# Patient Record
Sex: Male | Born: 1945 | Race: White | Hispanic: No | Marital: Married | Smoking: Current every day smoker
Health system: Southern US, Community
[De-identification: ages and names within clinical notes are randomized; demographics above are authoritative.]

## PROBLEM LIST (undated history)

## (undated) DIAGNOSIS — I4891 Unspecified atrial fibrillation: Secondary | ICD-10-CM

## (undated) DIAGNOSIS — J449 Chronic obstructive pulmonary disease, unspecified: Secondary | ICD-10-CM

## (undated) DIAGNOSIS — I493 Ventricular premature depolarization: Secondary | ICD-10-CM

## (undated) DIAGNOSIS — H9190 Unspecified hearing loss, unspecified ear: Secondary | ICD-10-CM

## (undated) DIAGNOSIS — I10 Essential (primary) hypertension: Secondary | ICD-10-CM

## (undated) DIAGNOSIS — Z972 Presence of dental prosthetic device (complete) (partial): Secondary | ICD-10-CM

## (undated) DIAGNOSIS — I714 Abdominal aortic aneurysm, without rupture, unspecified: Secondary | ICD-10-CM

## (undated) DIAGNOSIS — I219 Acute myocardial infarction, unspecified: Secondary | ICD-10-CM

## (undated) DIAGNOSIS — Z77098 Contact with and (suspected) exposure to other hazardous, chiefly nonmedicinal, chemicals: Secondary | ICD-10-CM

## (undated) DIAGNOSIS — Z789 Other specified health status: Secondary | ICD-10-CM

## (undated) HISTORY — PX: NECK SURGERY: SHX720

## (undated) HISTORY — PX: ABDOMINAL AORTIC ANEURYSM REPAIR: SHX42

## (undated) HISTORY — PX: CARDIAC CATHETERIZATION: SHX172

---

## 2009-04-24 DIAGNOSIS — I639 Cerebral infarction, unspecified: Secondary | ICD-10-CM

## 2009-04-24 HISTORY — DX: Cerebral infarction, unspecified: I63.9

## 2010-06-28 ENCOUNTER — Emergency Department: Payer: Self-pay | Admitting: Emergency Medicine
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
## 2021-08-03 ENCOUNTER — Encounter: Payer: Self-pay | Admitting: Ophthalmology

## 2021-08-05 NOTE — Discharge Instructions (Signed)

## 2021-08-09 ENCOUNTER — Other Ambulatory Visit: Payer: Self-pay

## 2021-08-09 ENCOUNTER — Ambulatory Visit: Payer: Medicare Other | Admitting: Anesthesiology

## 2021-08-09 ENCOUNTER — Ambulatory Visit
Admission: RE | Admit: 2021-08-09 | Discharge: 2021-08-09 | Disposition: A | Discharge status: Home / Self Care | Payer: Medicare Other | Attending: Ophthalmology | Admitting: Ophthalmology

## 2021-08-09 ENCOUNTER — Encounter
Admission: RE | Disposition: A | Discharge status: Home / Self Care | Payer: Self-pay | Source: Home / Self Care | Attending: Ophthalmology

## 2021-08-09 ENCOUNTER — Encounter: Payer: Self-pay | Admitting: Ophthalmology

## 2021-08-09 DIAGNOSIS — I252 Old myocardial infarction: Secondary | ICD-10-CM | POA: Insufficient documentation

## 2021-08-09 DIAGNOSIS — H2511 Age-related nuclear cataract, right eye: Secondary | ICD-10-CM | POA: Insufficient documentation

## 2021-08-09 DIAGNOSIS — I10 Essential (primary) hypertension: Secondary | ICD-10-CM | POA: Insufficient documentation

## 2021-08-09 DIAGNOSIS — I4891 Unspecified atrial fibrillation: Secondary | ICD-10-CM | POA: Insufficient documentation

## 2021-08-09 DIAGNOSIS — F17210 Nicotine dependence, cigarettes, uncomplicated: Secondary | ICD-10-CM | POA: Diagnosis not present

## 2021-08-09 DIAGNOSIS — J449 Chronic obstructive pulmonary disease, unspecified: Secondary | ICD-10-CM | POA: Insufficient documentation

## 2021-08-09 DIAGNOSIS — Z955 Presence of coronary angioplasty implant and graft: Secondary | ICD-10-CM | POA: Insufficient documentation

## 2021-08-09 DIAGNOSIS — Z8673 Personal history of transient ischemic attack (TIA), and cerebral infarction without residual deficits: Secondary | ICD-10-CM | POA: Diagnosis not present

## 2021-08-09 HISTORY — DX: Unspecified hearing loss, unspecified ear: H91.90

## 2021-08-09 HISTORY — DX: Contact with and (suspected) exposure to other hazardous, chiefly nonmedicinal, chemicals: Z77.098

## 2021-08-09 HISTORY — DX: Acute myocardial infarction, unspecified: I21.9

## 2021-08-09 HISTORY — DX: Unspecified atrial fibrillation: I48.91

## 2021-08-09 HISTORY — DX: Essential (primary) hypertension: I10

## 2021-08-09 HISTORY — DX: Other specified health status: Z78.9

## 2021-08-09 HISTORY — DX: Ventricular premature depolarization: I49.3

## 2021-08-09 HISTORY — DX: Presence of dental prosthetic device (complete) (partial): Z97.2

## 2021-08-09 HISTORY — DX: Abdominal aortic aneurysm, without rupture, unspecified: I71.40

## 2021-08-09 HISTORY — PX: CATARACT EXTRACTION W/PHACO: SHX586

## 2021-08-09 HISTORY — DX: Chronic obstructive pulmonary disease, unspecified: J44.9

## 2021-08-09 SURGERY — PHACOEMULSIFICATION, CATARACT, WITH IOL INSERTION
Anesthesia: Monitor Anesthesia Care | Site: Eye | Laterality: Right

## 2021-08-09 MED ORDER — SIGHTPATH DOSE#1 BSS IO SOLN
INTRAOCULAR | Status: DC | PRN
  Administered 2021-08-09: 48 mL via OPHTHALMIC

## 2021-08-09 MED ORDER — MOXIFLOXACIN HCL 0.5 % OP SOLN
OPHTHALMIC | Status: DC | PRN
  Administered 2021-08-09: 0.2 mL via OPHTHALMIC

## 2021-08-09 MED ORDER — SIGHTPATH DOSE#1 BSS IO SOLN
INTRAOCULAR | Status: DC | PRN
  Administered 2021-08-09: 15 mL

## 2021-08-09 MED ORDER — SIGHTPATH DOSE#1 NA CHONDROIT SULF-NA HYALURON 40-17 MG/ML IO SOLN
INTRAOCULAR | Status: DC | PRN
  Administered 2021-08-09: 1 mL via INTRAOCULAR

## 2021-08-09 MED ORDER — TETRACAINE HCL 0.5 % OP SOLN
1.0000 [drp] | OPHTHALMIC | Status: DC | PRN
  Administered 2021-08-09 (×3): 1 [drp] via OPHTHALMIC

## 2021-08-09 MED ORDER — MIDAZOLAM HCL 2 MG/2ML IJ SOLN
INTRAMUSCULAR | Status: DC | PRN
  Administered 2021-08-09: 1 mg via INTRAVENOUS

## 2021-08-09 MED ORDER — FENTANYL CITRATE (PF) 100 MCG/2ML IJ SOLN
INTRAMUSCULAR | Status: DC | PRN
  Administered 2021-08-09: 50 ug via INTRAVENOUS

## 2021-08-09 MED ORDER — SIGHTPATH DOSE#1 BSS IO SOLN
INTRAOCULAR | Status: DC | PRN
  Administered 2021-08-09: 1 mL via INTRAMUSCULAR

## 2021-08-09 MED ORDER — OXYCODONE HCL 5 MG/5ML PO SOLN: 5.0000 mg | Freq: Once | ORAL | Status: DC | PRN

## 2021-08-09 MED ORDER — OXYCODONE HCL 5 MG PO TABS: 5.0000 mg | ORAL_TABLET | Freq: Once | ORAL | Status: DC | PRN

## 2021-08-09 MED ORDER — LACTATED RINGERS IV SOLN: INTRAVENOUS | Status: DC

## 2021-08-09 MED ORDER — BRIMONIDINE TARTRATE-TIMOLOL 0.2-0.5 % OP SOLN
OPHTHALMIC | Status: DC | PRN
  Administered 2021-08-09: 1 [drp] via OPHTHALMIC

## 2021-08-09 MED ORDER — ARMC OPHTHALMIC DILATING DROPS
1.0000 "application " | OPHTHALMIC | Status: DC | PRN
  Administered 2021-08-09 (×3): 1 via OPHTHALMIC

## 2021-08-09 SURGICAL SUPPLY — 10 items
CATARACT SUITE SIGHTPATH (MISCELLANEOUS) ×2 IMPLANT
FEE CATARACT SUITE SIGHTPATH (MISCELLANEOUS) ×1 IMPLANT
GLOVE SURG ENC TEXT LTX SZ8 (GLOVE) ×2 IMPLANT
GLOVE SURG TRIUMPH 8.0 PF LTX (GLOVE) ×2 IMPLANT
LENS IOL TECNIS EYHANCE 20.5 (Intraocular Lens) ×1 IMPLANT
NDL FILTER BLUNT 18X1 1/2 (NEEDLE) ×1 IMPLANT
NEEDLE FILTER BLUNT 18X 1/2SAF (NEEDLE) ×1
NEEDLE FILTER BLUNT 18X1 1/2 (NEEDLE) ×1 IMPLANT
SYR 3ML LL SCALE MARK (SYRINGE) ×2 IMPLANT
WATER STERILE IRR 250ML POUR (IV SOLUTION) ×2 IMPLANT

## 2021-08-09 NOTE — Op Note (Signed)
PREOPERATIVE DIAGNOSIS:  Nuclear sclerotic cataract of the right eye. ?  ?POSTOPERATIVE DIAGNOSIS:  H25.11 CATARACT ?  ?OPERATIVE PROCEDURE:ORPROCALL@ ?  ?SURGEON:  Birder Robson, MD. ?  ?ANESTHESIA: ? ?Anesthesiologist: Fidel Levy, MD ?CRNA: Silvana Newness, CRNA ? ?1.      Managed anesthesia care. ?2.      0.65ml of Shugarcaine was instilled in the eye following the paracentesis. ?  ?COMPLICATIONS:  None. ?  ?TECHNIQUE:   Stop and chop ?  ?DESCRIPTION OF PROCEDURE:  The patient was examined and consented in the preoperative holding area where the aforementioned topical anesthesia was applied to the right eye and then brought back to the Operating Room where the right eye was prepped and draped in the usual sterile ophthalmic fashion and a lid speculum was placed. A paracentesis was created with the side port blade and the anterior chamber was filled with viscoelastic. A near clear corneal incision was performed with the steel keratome. A continuous curvilinear capsulorrhexis was performed with a cystotome followed by the capsulorrhexis forceps. Hydrodissection and hydrodelineation were carried out with BSS on a blunt cannula. The lens was removed in a stop and chop  technique and the remaining cortical material was removed with the irrigation-aspiration handpiece. The capsular bag was inflated with viscoelastic and the Technis [id number]  lens was placed in the capsular bag without complication. The remaining viscoelastic was removed from the eye with the irrigation-aspiration handpiece. The wounds were hydrated. The anterior chamber was flushed with BSS and the eye was inflated to physiologic pressure. 0.77ml of Vigamox was placed in the anterior chamber. The wounds were found to be water tight. The eye was dressed with Combigan. The patient was given protective glasses to wear throughout the day and a shield with which to sleep tonight. The patient was also given drops with which to begin a drop regimen today and  will follow-up with me in one day. ?Implant Name Type Inv. Item Serial No. Manufacturer Lot No. LRB No. Used Action  ?LENS IOL TECNIS EYHANCE 20.5 - [id number] Intraocular Lens LENS IOL TECNIS EYHANCE 20.5 [id number] [employer]  Right 1 Implanted  ? ?Procedure(s): ?CATARACT EXTRACTION PHACO AND INTRAOCULAR LENS PLACEMENT (IOC)right 11.10 00:58.6 (Right) ? ?Electronically signed: Birder Robson 08/09/2021 11:31 AM ? ?

## 2021-08-09 NOTE — Anesthesia Postprocedure Evaluation (Signed)
Anesthesia Post Note ? ?Patient: Donald Knight ? ?Procedure(s) Performed: CATARACT EXTRACTION PHACO AND INTRAOCULAR LENS PLACEMENT (IOC)right 11.10 00:58.6 (Right: Eye) ? ? ?  ?Patient location during evaluation: PACU ?Anesthesia Type: MAC ?Level of consciousness: awake and alert ?Pain management: pain level controlled ?Vital Signs Assessment: post-procedure vital signs reviewed and stable ?Respiratory status: spontaneous breathing, nonlabored ventilation, respiratory function stable and patient connected to nasal cannula oxygen ?Cardiovascular status: stable and blood pressure returned to baseline ?Postop Assessment: no apparent nausea or vomiting ?Anesthetic complications: no ? ? ?No notable events documented. ? ?Fidel Levy ? ? ? ? ? ?

## 2021-08-09 NOTE — Transfer of Care (Signed)
Immediate Anesthesia Transfer of Care Note ? ?Patient: Donald Knight ? ?Procedure(s) Performed: CATARACT EXTRACTION PHACO AND INTRAOCULAR LENS PLACEMENT (IOC)right 11.10 00:58.6 (Right: Eye) ? ?Patient Location: PACU ? ?Anesthesia Type: MAC ? ?Level of Consciousness: awake, alert  and patient cooperative ? ?Airway and Oxygen Therapy: Patient Spontanous Breathing and Patient connected to supplemental oxygen ? ?Post-op Assessment: Post-op Vital signs reviewed, Patient's Cardiovascular Status Stable, Respiratory Function Stable, Patent Airway and No signs of Nausea or vomiting ? ?Post-op Vital Signs: Reviewed and stable ? ?Complications: No notable events documented. ? ?

## 2021-08-09 NOTE — H&P (Signed)
[facility]  ? ?Primary Care Physician:  [facility] ?Ophthalmologist: Dr. Druscilla Brownie ? ?Pre-Procedure History & Physical: ?HPI:  Donald Knight is a 76 y.o. male here for cataract surgery. ?  ?Past Medical History:  ?Diagnosis Date  ? AAA (abdominal aortic aneurysm) without rupture (HCC)   ? repaired 2013  ? Atrial fibrillation (HCC)   ? COPD (chronic obstructive pulmonary disease) (HCC)   ? H/O agent Orange exposure   ? HOH (hard of hearing)   ? Has hearing aides.  Does not wear  ? Hypertension   ? Medical history non-contributory   ? Myocardial infarction [facility])   ? x2. '91, '06  ? PVCs (premature ventricular contractions)   ? Stroke [facility]) 2011  ? R sided cerebral infarct, walking issues and speech deficit  ? Wears dentures   ? full upper, partial lower  ? ? ?Past Surgical History:  ?Procedure Laterality Date  ? ABDOMINAL AORTIC ANEURYSM REPAIR    ? 3 aneurysms.  Abdominal and 2 renal.  ? CARDIAC CATHETERIZATION    ? stents placed  ? NECK SURGERY    ? ? ?Prior to Admission medications   ?Medication Sig Start Date End Date Taking? Authorizing Provider  ?albuterol (VENTOLIN HFA) 108 (90 Base) MCG/ACT inhaler Inhale into the lungs every 6 (six) hours as needed for wheezing or shortness of breath.   Yes [provider]  ?apixaban (ELIQUIS) 5 MG TABS tablet Take 5 mg by mouth 2 (two) times daily.   Yes [provider]  ?ASPIRIN 81 PO Take by mouth daily.   Yes [provider]  ?atorvastatin (LIPITOR) 40 MG tablet Take 40 mg by mouth daily.   Yes [provider]  ?cetirizine (ZYRTEC) 10 MG tablet Take 10 mg by mouth daily.   Yes [provider]  ?diltiazem (DILACOR XR) 120 MG 24 hr capsule Take 120 mg by mouth daily.   Yes [provider]  ?furosemide (LASIX) 20 MG tablet Take 20 mg by mouth daily.   Yes [provider]  ?ipratropium (ATROVENT) 0.06 % nasal spray Place 2 sprays into both nostrils as needed for rhinitis.   Yes [provider]  ?lisinopril (ZESTRIL) 10 MG tablet Take 10 mg by mouth daily.   Yes [provider]  ?potassium chloride SA (KLOR-CON M) 20 MEQ tablet Take 20 mEq by mouth daily.   Yes [provider]  ?sennosides-docusate sodium (SENOKOT-S) 8.6-50 MG tablet Take 1 tablet by mouth daily.   Yes [provider]  ?vitamin C (ASCORBIC ACID) 500 MG tablet Take 500 mg by mouth daily.   Yes [provider]  ?ferrous sulfate 325 (65 FE) MG tablet Take 325 mg by mouth daily. ?Patient not taking: Reported on 08/03/2021    [provider]  ? ? ?Allergies as of 07/06/2021  ? (Not on File)  ? ? ?History reviewed. No pertinent family history. ? ?Social History  ? ?Socioeconomic History  ? Marital status: Married  ?  Spouse name: Not on file  ? Number of children: Not on file  ? Years of education: Not on file  ? Highest education level: Not on file  ?Occupational History  ? Not on file  ?Tobacco Use  ? Smoking status: Every Day  ?  Packs/day: 0.10  ?  Types: Cigarettes  ? Smokeless tobacco: Never  ?Vaping Use  ? Vaping Use: Never used  ?Substance and Sexual Activity  ? Alcohol use: Not Currently  ?  Drug use: Not on file  ? Sexual activity: Not on file  ?Other Topics Concern  ? Not on file  ?Social History Narrative  ? Not on file  ? ?Social Determinants of Health  ? ?Financial Resource Strain: Not on file  ?Food Insecurity: Not on file  ?Transportation Needs: Not on file  ?Physical Activity: Not on file  ?Stress: Not on file  ?Social Connections: Not on file  ?Intimate Partner Violence: Not on file  ? ? ?Review of Systems: ?See HPI, otherwise negative ROS ? ?Physical Exam: ?BP (!) 143/81   Pulse 94   Temp (!) 97.3 ?F (36.3 ?C) (Temporal)   Ht 5\' 7"  (1.702 m)   Wt 72.1 kg   SpO2 99%   BMI 24.90 kg/m?  ?General:   Alert, cooperative in NAD ?Head:  Normocephalic and atraumatic. ?Respiratory:  Normal work of breathing. ?Cardiovascular:  RRR ? ?Impression/Plan: ?Donald Knight is here for  cataract surgery. ? ?Risks, benefits, limitations, and alternatives regarding cataract surgery have been reviewed with the patient.  Questions have been answered.  All parties agreeable. ? ? ?Kathryne Gin, MD  08/09/2021, 11:04 AM ? ? ?

## 2021-08-09 NOTE — Anesthesia Preprocedure Evaluation (Signed)
Anesthesia Evaluation  ?Patient identified by MRN, date of birth, ID band ?Patient awake ? ? ? ?Reviewed: ?NPO status  ? ?History of Anesthesia Complications ?Negative for: history of anesthetic complications ? ?Airway ?Mallampati: II ? ?TM Distance: >3 FB ?Neck ROM: full ? ? ? Dental ? ?(+) Upper Dentures, Lower Dentures, Poor Dentition, Missing ?  ?Pulmonary ?COPD (mild), Current Smoker and Patient abstained from smoking.,  ?  ?Pulmonary exam normal ? ? ? ? ? ? ? Cardiovascular ?Exercise Tolerance: Good ?hypertension, + Past MI (x2 2006; ), + Cardiac Stents (cad x3 stents; 2006) and + Peripheral Vascular Disease (AAA repair 2014;)  ?+ dysrhythmias Atrial Fibrillation  ? ?echo: 2017:ef=55%; ???NORMAL LEFT VENTRICULAR SYSTOLIC FUNCTION WITH MODERATE LVH  ???NORMAL LA PRESSURES WITH NORMAL DIASTOLIC FUNCTION  ???NORMAL RIGHT VENTRICULAR SYSTOLIC FUNCTION  ???VALVULAR REGURGITATION: MODERATE MR, TRIVIAL PR, TRIVIAL TR  ???NO VALVULAR STENOSIS ; ? ?  ?Neuro/Psych ?HOH ?CVA (2011; L weak; speech impairment;), Residual Symptoms negative psych ROS  ? GI/Hepatic ?negative GI ROS, Neg liver ROS,   ?Endo/Other  ?negative endocrine ROS ? Renal/GU ?negative Renal ROS  ?negative genitourinary ?  ?Musculoskeletal ? ? Abdominal ?  ?Peds ? Hematology ? ?(+) Blood dyscrasia, anemia ,   ?Anesthesia Other Findings ? ?last eliquis: 4/18; ? ?hx from wife; ? ?H/o agent orange exposure from [place]; ? Reproductive/Obstetrics ? ?  ? ? ? ? ? ? ? ? ? ? ? ? ? ?  ?  ? ? ? ? ? ? ? ? ?Anesthesia Physical ?Anesthesia Plan ? ?ASA: 3 ? ?Anesthesia Plan: MAC  ? ?Post-op Pain Management:   ? ?Induction:  ? ?PONV Risk Score and Plan: 1 and Midazolam ? ?Airway Management Planned:  ? ?Additional Equipment:  ? ?Intra-op Plan:  ? ?Post-operative Plan:  ? ?Informed Consent: I have reviewed the patients History and Physical, chart, labs and discussed the procedure including the risks, benefits and alternatives for the  proposed anesthesia with the patient or authorized representative who has indicated his/her understanding and acceptance.  ? ? ? ? ? ?Plan Discussed with: CRNA ? ?Anesthesia Plan Comments:   ? ? ? ? ? ? ?Anesthesia Quick Evaluation ? ?

## 2021-08-10 ENCOUNTER — Encounter: Payer: Self-pay | Admitting: Ophthalmology

## 2021-08-16 ENCOUNTER — Encounter: Payer: Self-pay | Admitting: Ophthalmology

## 2021-08-18 NOTE — Discharge Instructions (Signed)

## 2021-08-23 ENCOUNTER — Encounter: Payer: Self-pay | Admitting: Ophthalmology

## 2021-08-23 ENCOUNTER — Other Ambulatory Visit: Payer: Self-pay

## 2021-08-23 ENCOUNTER — Ambulatory Visit
Admission: RE | Admit: 2021-08-23 | Discharge: 2021-08-23 | Disposition: A | Discharge status: Home / Self Care | Payer: Medicare Other | Attending: Ophthalmology | Admitting: Ophthalmology

## 2021-08-23 ENCOUNTER — Ambulatory Visit: Payer: Medicare Other | Admitting: Anesthesiology

## 2021-08-23 ENCOUNTER — Encounter
Admission: RE | Disposition: A | Discharge status: Home / Self Care | Payer: Self-pay | Source: Home / Self Care | Attending: Ophthalmology

## 2021-08-23 DIAGNOSIS — F17210 Nicotine dependence, cigarettes, uncomplicated: Secondary | ICD-10-CM | POA: Insufficient documentation

## 2021-08-23 DIAGNOSIS — I10 Essential (primary) hypertension: Secondary | ICD-10-CM | POA: Insufficient documentation

## 2021-08-23 DIAGNOSIS — H2512 Age-related nuclear cataract, left eye: Secondary | ICD-10-CM | POA: Diagnosis not present

## 2021-08-23 DIAGNOSIS — H25812 Combined forms of age-related cataract, left eye: Secondary | ICD-10-CM | POA: Diagnosis not present

## 2021-08-23 DIAGNOSIS — J449 Chronic obstructive pulmonary disease, unspecified: Secondary | ICD-10-CM | POA: Diagnosis not present

## 2021-08-23 HISTORY — PX: CATARACT EXTRACTION W/PHACO: SHX586

## 2021-08-23 SURGERY — PHACOEMULSIFICATION, CATARACT, WITH IOL INSERTION
Anesthesia: Monitor Anesthesia Care | Site: Eye | Laterality: Left

## 2021-08-23 MED ORDER — SIGHTPATH DOSE#1 BSS IO SOLN
INTRAOCULAR | Status: DC | PRN
  Administered 2021-08-23: 15 mL

## 2021-08-23 MED ORDER — LACTATED RINGERS IV SOLN: INTRAVENOUS | Status: DC

## 2021-08-23 MED ORDER — OXYCODONE HCL 5 MG/5ML PO SOLN: 5.0000 mg | Freq: Once | ORAL | Status: DC | PRN

## 2021-08-23 MED ORDER — OXYCODONE HCL 5 MG PO TABS: 5.0000 mg | ORAL_TABLET | Freq: Once | ORAL | Status: DC | PRN

## 2021-08-23 MED ORDER — FENTANYL CITRATE PF 50 MCG/ML IJ SOSY: 25.0000 ug | PREFILLED_SYRINGE | INTRAMUSCULAR | Status: DC | PRN

## 2021-08-23 MED ORDER — TETRACAINE HCL 0.5 % OP SOLN
1.0000 [drp] | OPHTHALMIC | Status: DC | PRN
  Administered 2021-08-23 (×3): 1 [drp] via OPHTHALMIC

## 2021-08-23 MED ORDER — SIGHTPATH DOSE#1 NA CHONDROIT SULF-NA HYALURON 40-17 MG/ML IO SOLN
INTRAOCULAR | Status: DC | PRN
  Administered 2021-08-23: 1 mL via INTRAOCULAR

## 2021-08-23 MED ORDER — FENTANYL CITRATE (PF) 100 MCG/2ML IJ SOLN
INTRAMUSCULAR | Status: DC | PRN
  Administered 2021-08-23: 50 ug via INTRAVENOUS

## 2021-08-23 MED ORDER — MEPERIDINE HCL 25 MG/ML IJ SOLN: 6.2500 mg | INTRAMUSCULAR | Status: DC | PRN

## 2021-08-23 MED ORDER — MOXIFLOXACIN HCL 0.5 % OP SOLN
OPHTHALMIC | Status: DC | PRN
Start: 2021-08-23 — End: 2021-08-23
  Administered 2021-08-23: 0.2 mL via OPHTHALMIC

## 2021-08-23 MED ORDER — BRIMONIDINE TARTRATE-TIMOLOL 0.2-0.5 % OP SOLN
OPHTHALMIC | Status: DC | PRN
  Administered 2021-08-23: 1 [drp] via OPHTHALMIC

## 2021-08-23 MED ORDER — ARMC OPHTHALMIC DILATING DROPS
1.0000 "application " | OPHTHALMIC | Status: DC | PRN
  Administered 2021-08-23 (×3): 1 via OPHTHALMIC

## 2021-08-23 MED ORDER — SIGHTPATH DOSE#1 BSS IO SOLN
INTRAOCULAR | Status: DC | PRN
  Administered 2021-08-23: 1 mL via INTRAMUSCULAR

## 2021-08-23 MED ORDER — EPINEPHRINE PF 1 MG/ML IJ SOLN
INTRAMUSCULAR | Status: DC | PRN
  Administered 2021-08-23: 48 mL via OPHTHALMIC

## 2021-08-23 SURGICAL SUPPLY — 10 items
CATARACT SUITE SIGHTPATH (MISCELLANEOUS) ×2 IMPLANT
FEE CATARACT SUITE SIGHTPATH (MISCELLANEOUS) ×1 IMPLANT
GLOVE SURG ENC TEXT LTX SZ8 (GLOVE) ×2 IMPLANT
GLOVE SURG TRIUMPH 8.0 PF LTX (GLOVE) ×2 IMPLANT
LENS IOL TECNIS EYHANCE 20.5 (Intraocular Lens) ×1 IMPLANT
NDL FILTER BLUNT 18X1 1/2 (NEEDLE) ×1 IMPLANT
NEEDLE FILTER BLUNT 18X 1/2SAF (NEEDLE) ×1
NEEDLE FILTER BLUNT 18X1 1/2 (NEEDLE) ×1 IMPLANT
SYR 3ML LL SCALE MARK (SYRINGE) ×2 IMPLANT
WATER STERILE IRR 250ML POUR (IV SOLUTION) ×2 IMPLANT

## 2021-08-23 NOTE — H&P (Signed)
[facility]  ? ?Primary Care Physician:  [facility] ?Ophthalmologist: Dr. Druscilla Brownie ? ?Pre-Procedure History & Physical: ?HPI:  Donald Knight is a 76 y.o. male here for cataract surgery. ?  ?Past Medical History:  ?Diagnosis Date  ? AAA (abdominal aortic aneurysm) without rupture (HCC)   ? repaired 2013  ? Atrial fibrillation (HCC)   ? COPD (chronic obstructive pulmonary disease) (HCC)   ? H/O agent Orange exposure   ? HOH (hard of hearing)   ? Has hearing aides.  Does not wear  ? Hypertension   ? Medical history non-contributory   ? Myocardial infarction [facility])   ? x2. '91, '06  ? PVCs (premature ventricular contractions)   ? Stroke [facility]) 2011  ? R sided cerebral infarct, walking issues and speech deficit  ? Wears dentures   ? full upper, partial lower  ? ? ?Past Surgical History:  ?Procedure Laterality Date  ? ABDOMINAL AORTIC ANEURYSM REPAIR    ? 3 aneurysms.  Abdominal and 2 renal.  ? CARDIAC CATHETERIZATION    ? stents placed  ? CATARACT EXTRACTION W/PHACO Right 08/09/2021  ? Procedure: CATARACT EXTRACTION PHACO AND INTRAOCULAR LENS PLACEMENT (IOC)right 11.10 00:58.6;  Surgeon: Galen Manila, MD;  Location: [facility];  Service: Ophthalmology;  Laterality: Right;  ? NECK SURGERY    ? ? ?Prior to Admission medications   ?Medication Sig Start Date End Date Taking? Authorizing Provider  ?albuterol (VENTOLIN HFA) 108 (90 Base) MCG/ACT inhaler Inhale into the lungs every 6 (six) hours as needed for wheezing or shortness of breath.   Yes [provider]  ?apixaban (ELIQUIS) 5 MG TABS tablet Take 5 mg by mouth 2 (two) times daily.   Yes [provider]  ?ASPIRIN 81 PO Take by mouth daily.   Yes [provider]  ?atorvastatin (LIPITOR) 40 MG tablet Take 40 mg by mouth daily.   Yes [provider]  ?cetirizine (ZYRTEC) 10 MG tablet Take 10 mg by mouth daily.   Yes [provider]  ?diltiazem (DILACOR XR) 120 MG 24 hr capsule Take 120 mg by mouth  daily.   Yes [provider]  ?furosemide (LASIX) 20 MG tablet Take 20 mg by mouth daily.   Yes [provider]  ?ipratropium (ATROVENT) 0.06 % nasal spray Place 2 sprays into both nostrils as needed for rhinitis.   Yes [provider]  ?lisinopril (ZESTRIL) 10 MG tablet Take 10 mg by mouth daily.   Yes [provider]  ?potassium chloride SA (KLOR-CON M) 20 MEQ tablet Take 20 mEq by mouth daily.   Yes [provider]  ?sennosides-docusate sodium (SENOKOT-S) 8.6-50 MG tablet Take 1 tablet by mouth daily.   Yes [provider]  ?vitamin C (ASCORBIC ACID) 500 MG tablet Take 500 mg by mouth daily.   Yes [provider]  ?ferrous sulfate 325 (65 FE) MG tablet Take 325 mg by mouth daily. ?Patient not taking: Reported on 08/03/2021    [provider]  ? ? ?Allergies as of 07/06/2021  ? (Not on File)  ? ? ?History reviewed. No pertinent family history. ? ?Social History  ? ?Socioeconomic History  ? Marital status: Married  ?  Spouse name: Not on file  ? Number of children: Not on file  ? Years of education: Not on file  ? Highest education level: Not on file  ?Occupational History  ? Not on file  ?Tobacco Use  ? Smoking status: Every Day  ?  Packs/day: 0.10  ?  Types: Cigarettes  ? Smokeless tobacco: Never  ?Vaping Use  ? Vaping Use: Never used  ?Substance and Sexual Activity  ? Alcohol use: Not Currently  ? Drug use: Not on file  ? Sexual activity: Not on file  ?Other Topics Concern  ? Not on file  ?Social History Narrative  ? Not on file  ? ?Social Determinants of Health  ? ?Financial Resource Strain: Not on file  ?Food Insecurity: Not on file  ?Transportation Needs: Not on file  ?Physical Activity: Not on file  ?Stress: Not on file  ?Social Connections: Not on file  ?Intimate Partner Violence: Not on file  ? ? ?Review of Systems: ?See HPI, otherwise negative ROS ? ?Physical Exam: ?BP 130/76   Pulse 71   Temp (!) 97.4 ?F (36.3 ?C) (Temporal)   Resp  16   Ht 5\' 7"  (1.702 m)   Wt 73.5 kg   SpO2 98%   BMI 25.37 kg/m?  ?General:   Alert, cooperative in NAD ?Head:  Normocephalic and atraumatic. ?Respiratory:  Normal work of breathing. ?Cardiovascular:  RRR ? ?Impression/Plan: ?Donald Knight is here for cataract surgery. ? ?Risks, benefits, limitations, and alternatives regarding cataract surgery have been reviewed with the patient.  Questions have been answered.  All parties agreeable. ? ? ?Kathryne Gin, MD  08/23/2021, 10:40 AM ? ? ?

## 2021-08-23 NOTE — Anesthesia Postprocedure Evaluation (Signed)
Anesthesia Post Note ? ?Patient: Donald Knight ? ?Procedure(s) Performed: CATARACT EXTRACTION PHACO AND INTRAOCULAR LENS PLACEMENT (IOC) LEFT 7.43 00:41.1 (Left: Eye) ? ? ?  ?Patient location during evaluation: PACU ?Anesthesia Type: MAC ?Level of consciousness: awake and alert ?Pain management: pain level controlled ?Vital Signs Assessment: post-procedure vital signs reviewed and stable ?Respiratory status: spontaneous breathing, nonlabored ventilation, respiratory function stable and patient connected to nasal cannula oxygen ?Cardiovascular status: stable and blood pressure returned to baseline ?Postop Assessment: no apparent nausea or vomiting ?Anesthetic complications: no ? ? ?No notable events documented. ? ?Caige Almeda ELAINE ? ? ? ? ? ?

## 2021-08-23 NOTE — Anesthesia Preprocedure Evaluation (Signed)
Anesthesia Evaluation  ?Patient identified by MRN, date of birth, ID band ?Patient awake ? ? ? ?Reviewed: ?Allergy & Precautions, H&P , NPO status , Patient's Chart, lab work & pertinent test results, reviewed documented beta blocker date and time  ? ?Airway ?Mallampati: II ? ?TM Distance: >3 FB ?Neck ROM: full ? ? ? Dental ?no notable dental hx. ? ?  ?Pulmonary ?COPD, Current Smoker and Patient abstained from smoking.,  ?  ?Pulmonary exam normal ?breath sounds clear to auscultation ? ? ? ? ? ? Cardiovascular ?Exercise Tolerance: Good ?hypertension, negative cardio ROS ? ? ?Rhythm:regular Rate:Normal ? ? ?  ?Neuro/Psych ?negative neurological ROS ? negative psych ROS  ? GI/Hepatic ?negative GI ROS, Neg liver ROS,   ?Endo/Other  ?negative endocrine ROS ? Renal/GU ?negative Renal ROS  ?negative genitourinary ?  ?Musculoskeletal ? ? Abdominal ?  ?Peds ? Hematology ?negative hematology ROS ?(+)   ?Anesthesia Other Findings ? ? Reproductive/Obstetrics ?negative OB ROS ? ?  ? ? ? ? ? ? ? ? ? ? ? ? ? ?  ?  ? ? ? ? ? ? ? ? ?Anesthesia Physical ?Anesthesia Plan ? ?ASA: 2 ? ?Anesthesia Plan: MAC  ? ?Post-op Pain Management:   ? ?Induction:  ? ?PONV Risk Score and Plan: 0 and Treatment may vary due to age or medical condition ? ?Airway Management Planned:  ? ?Additional Equipment:  ? ?Intra-op Plan:  ? ?Post-operative Plan:  ? ?Informed Consent: I have reviewed the patients History and Physical, chart, labs and discussed the procedure including the risks, benefits and alternatives for the proposed anesthesia with the patient or authorized representative who has indicated his/her understanding and acceptance.  ? ? ? ?Dental Advisory Given ? ?Plan Discussed with: CRNA ? ?Anesthesia Plan Comments:   ? ? ? ? ? ? ?Anesthesia Quick Evaluation ? ?

## 2021-08-23 NOTE — Op Note (Signed)
PREOPERATIVE DIAGNOSIS:  Nuclear sclerotic cataract of the left eye. ?  ?POSTOPERATIVE DIAGNOSIS:  Nuclear sclerotic cataract of the left eye. ?  ?OPERATIVE PROCEDURE:ORPROCALL@ ?  ?SURGEON:  Galen Manila, MD. ?  ?ANESTHESIA: ? ?Anesthesiologist: Aldine Contes, MD ?CRNA: Derenda Mis, CRNA ? ?1.      Managed anesthesia care. ?2.     0.21ml of Shugarcaine was instilled following the paracentesis ?  ?COMPLICATIONS:  None. ?  ?TECHNIQUE:   Stop and chop ?  ?DESCRIPTION OF PROCEDURE:  The patient was examined and consented in the preoperative holding area where the aforementioned topical anesthesia was applied to the left eye and then brought back to the Operating Room where the left eye was prepped and draped in the usual sterile ophthalmic fashion and a lid speculum was placed. A paracentesis was created with the side port blade and the anterior chamber was filled with viscoelastic. A near clear corneal incision was performed with the steel keratome. A continuous curvilinear capsulorrhexis was performed with a cystotome followed by the capsulorrhexis forceps. Hydrodissection and hydrodelineation were carried out with BSS on a blunt cannula. The lens was removed in a stop and chop  technique and the remaining cortical material was removed with the irrigation-aspiration handpiece. The capsular bag was inflated with viscoelastic and the Technis [id number] lens was placed in the capsular bag without complication. The remaining viscoelastic was removed from the eye with the irrigation-aspiration handpiece. The wounds were hydrated. The anterior chamber was flushed with BSS and the eye was inflated to physiologic pressure. 0.62ml Vigamox was placed in the anterior chamber. The wounds were found to be water tight. The eye was dressed with Combigan. The patient was given protective glasses to wear throughout the day and a shield with which to sleep tonight. The patient was also given drops with which to begin a drop  regimen today and will follow-up with me in one day. ?Implant Name Type Inv. Item Serial No. Manufacturer Lot No. LRB No. Used Action  ?LENS IOL TECNIS EYHANCE 20.5 - [id number] Intraocular Lens LENS IOL TECNIS EYHANCE 20.5 [id number] [employer]  Left 1 Implanted  ?  ?Procedure(s): ?CATARACT EXTRACTION PHACO AND INTRAOCULAR LENS PLACEMENT (IOC) LEFT 7.43 00:41.1 (Left) ? ?Electronically signed: Galen Manila 08/23/2021 11:04 AM ? ?

## 2021-08-23 NOTE — Transfer of Care (Signed)
Immediate Anesthesia Transfer of Care Note ? ?Patient: Donald Knight ? ?Procedure(s) Performed: CATARACT EXTRACTION PHACO AND INTRAOCULAR LENS PLACEMENT (IOC) LEFT 7.43 00:41.1 (Left: Eye) ? ?Patient Location: PACU ? ?Anesthesia Type: MAC ? ?Level of Consciousness: awake, alert  and patient cooperative ? ?Airway and Oxygen Therapy: Patient Spontanous Breathing and Patient connected to supplemental oxygen ? ?Post-op Assessment: Post-op Vital signs reviewed, Patient's Cardiovascular Status Stable, Respiratory Function Stable, Patent Airway and No signs of Nausea or vomiting ? ?Post-op Vital Signs: Reviewed and stable ? ?Complications: No notable events documented. ? ?

## 2021-08-24 ENCOUNTER — Encounter: Payer: Self-pay | Admitting: Ophthalmology

## 2021-09-15 DIAGNOSIS — Z961 Presence of intraocular lens: Secondary | ICD-10-CM | POA: Diagnosis not present

## 2021-12-05 DIAGNOSIS — B351 Tinea unguium: Secondary | ICD-10-CM | POA: Diagnosis not present

## 2021-12-05 DIAGNOSIS — Z7901 Long term (current) use of anticoagulants: Secondary | ICD-10-CM | POA: Diagnosis not present

## 2021-12-05 DIAGNOSIS — M79674 Pain in right toe(s): Secondary | ICD-10-CM | POA: Diagnosis not present

## 2021-12-05 DIAGNOSIS — I739 Peripheral vascular disease, unspecified: Secondary | ICD-10-CM | POA: Diagnosis not present

## 2021-12-05 DIAGNOSIS — M79675 Pain in left toe(s): Secondary | ICD-10-CM | POA: Diagnosis not present

## 2022-03-15 DIAGNOSIS — Z961 Presence of intraocular lens: Secondary | ICD-10-CM | POA: Diagnosis not present

## 2023-05-26 DEATH — deceased
